# Patient Record
Sex: Male | Born: 1980
Health system: Southern US, Community
[De-identification: ages and names within clinical notes are randomized; demographics above are authoritative.]

## PROBLEM LIST (undated history)

## (undated) DIAGNOSIS — I10 Essential (primary) hypertension: Secondary | ICD-10-CM

## (undated) HISTORY — PX: EYE SURGERY: SHX253

---

## 2013-06-11 ENCOUNTER — Emergency Department (HOSPITAL_BASED_OUTPATIENT_CLINIC_OR_DEPARTMENT_OTHER)
Admission: EM | Admit: 2013-06-11 | Discharge: 2013-06-11 | Disposition: A | Discharge status: Home / Self Care | Payer: 59 | Attending: Emergency Medicine | Admitting: Emergency Medicine

## 2013-06-11 ENCOUNTER — Encounter (HOSPITAL_BASED_OUTPATIENT_CLINIC_OR_DEPARTMENT_OTHER): Payer: Self-pay | Admitting: Emergency Medicine

## 2013-06-11 DIAGNOSIS — F17200 Nicotine dependence, unspecified, uncomplicated: Secondary | ICD-10-CM | POA: Insufficient documentation

## 2013-06-11 DIAGNOSIS — I10 Essential (primary) hypertension: Secondary | ICD-10-CM | POA: Insufficient documentation

## 2013-06-11 DIAGNOSIS — E876 Hypokalemia: Secondary | ICD-10-CM | POA: Insufficient documentation

## 2013-06-11 HISTORY — DX: Essential (primary) hypertension: I10

## 2013-06-11 LAB — BASIC METABOLIC PANEL
BUN: 9 mg/dL (ref 6–23)
CHLORIDE: 101 meq/L (ref 96–112)
CO2: 28 mEq/L (ref 19–32)
CREATININE: 0.8 mg/dL (ref 0.50–1.35)
Calcium: 9.3 mg/dL (ref 8.4–10.5)
GFR calc Af Amer: >90 mL/min (ref >90)
GFR calc non Af Amer: >90 mL/min (ref >90)
Glucose, Bld: 109 mg/dL — ABNORMAL HIGH (ref 70–99)
Potassium: 3 mEq/L — ABNORMAL LOW (ref 3.7–5.3)
SODIUM: 142 meq/L (ref 137–147)

## 2013-06-11 MED ORDER — POTASSIUM CHLORIDE CRYS ER 20 MEQ PO TBCR
40.0000 meq | EXTENDED_RELEASE_TABLET | Freq: Once | ORAL | Status: AC
  Administered 2013-06-11: 40 meq via ORAL
  Filled 2013-06-11: qty 2

## 2013-06-11 MED ORDER — POTASSIUM CHLORIDE CRYS ER 20 MEQ PO TBCR: 20.0000 meq | EXTENDED_RELEASE_TABLET | Freq: Two times a day (BID) | ORAL | Status: AC

## 2013-06-11 NOTE — ED Notes (Signed)
Pain in his legs for the past 2 weeks since starting BP medication.

## 2013-06-11 NOTE — ED Provider Notes (Signed)
CSN: [id number]     Arrival date & time 06/11/13  1710 History   First MD Initiated Contact with Patient 06/11/13 1725     Chief Complaint  Patient presents with  . Leg Pain   (Consider location/radiation/quality/duration/timing/severity/associated sxs/prior Treatment) HPI Comments: Pt c/o bilateral leg pain that started 2 weeks ago. Pt states that he started his bp medication 3 weeks ago. Pt was started on amlodipine. Denies swelling, redness or warmth:pt states that he wrapped his legs last night and the pain was worse this morning:pt states that he didn't take his bp medication today because he thought it might be the cause of his pain:denies cp  The history is provided by the patient. No language interpreter was used.    Past Medical History  Diagnosis Date  . Hypertension    Past Surgical History  Procedure Laterality Date  . Eye surgery     No family history on file. History  Substance Use Topics  . Smoking status: Current Every Day Smoker -- 0.50 packs/day    Types: Cigarettes  . Smokeless tobacco: Not on file  . Alcohol Use: Yes    Review of Systems  Constitutional: Negative.   Respiratory: Negative.   Cardiovascular: Negative.     Allergies  Percocet  Home Medications  No current outpatient prescriptions on file. BP 190/115  Pulse 86  Temp(Src) 98.3 F (36.8 C) (Oral)  Resp 20  Ht 6\' 2"  (1.88 m)  Wt 290 lb (131.543 kg)  BMI 37.22 kg/m2  SpO2 97% Physical Exam  Nursing note and vitals reviewed. Constitutional: He appears well-developed and well-nourished.  Cardiovascular: Normal rate and regular rhythm.   Pulmonary/Chest: Effort normal and breath sounds normal.  Abdominal: Bowel sounds are normal.  Musculoskeletal: Normal range of motion.  No swelling redness or warmth noted to bilateral lower extremities  Neurological: He is alert. Coordination normal.  Skin: Skin is warm and dry.    ED Course  Procedures (including critical care time) Labs  Review Labs Reviewed  BASIC METABOLIC PANEL - Abnormal; Notable for the following:    Potassium 3.0 (*)    Glucose, Bld 109 (*)    All other components within normal limits   Imaging Review No results found.  EKG Interpretation   None       MDM   1. Hypokalemia   2. HTN (hypertension)    Will treat for hypokalemia:will have pt replace with potassium in the next couple of days:discussed with pt that he need to have labs redrawn in the next week    Teressa LowerVrinda Maleeyah Mccaughey, NP 06/11/13 1942

## 2013-06-11 NOTE — Discharge Instructions (Signed)
Potassium Content of Foods Potassium is a mineral found in many foods and drinks. It helps keep fluids and minerals balanced in your body and also affects how steadily your heart beats. The body needs potassium to control blood pressure and to keep the muscles and nervous system healthy. However, certain health conditions and medicine may require you to eat more or less potassium-rich foods and drinks. Your caregiver or dietitian will tell you how much potassium you should have each day. COMMON SERVING SIZES The list below tells you how big or small common portion sizes are:  1 oz.........4 stacked dice.  3 oz.........Deck of cards.  1 tsp........Tip of little finger.  1 tbsp......Thumb.  2 tbsp......Golf ball.   c...........Half of a fist.  1 c............A fist. FOODS AND DRINKS HIGH IN POTASSIUM More than 200 mg of potassium per serving. A serving size is  c (120 mL or noted gram weight) unless otherwise stated. While all the items on this list are high in potassium, some items are higher in potassium than others. Fruits  Apricots (sliced), 83 g.  Apricots (dried halves), 3 oz / 24 g.  Avocado (cubed),  c / 50 g.  Banana (sliced), 75 g.  Cantaloupe (cubed), 80 g.  Dates (pitted), 5 whole / 35 g.  Figs (dried), 4 whole / 32 g.  Guava, c / 55 g.  Honeydew, 1 wedge / 85 g.  Kiwi (sliced), 90 g.  Nectarine, 1 small / 129 g.  Orange, 1 medium / 131 g.  Orange juice.  Pomegranate seeds, 87 g.  Pomegranate juice.  Prunes (pitted), 3 whole / 30 g.  Prune juice, 3 oz / 90 mL.  Seedless raisins, 3 tbsp / 27 g. Vegetables  Artichoke,  of a medium / 64 g.  Asparagus (boiled), 90 g.  Baked beans,  c / 63 g.  Bamboo shoots,  c / 38 g.  Beets (cooked slices), 85 g.  Broccoli (boiled), 78 g.  Brussels sprout (boiled), 78 g.  Butternut squash (baked), 103 g.  Chickpea (cooked), 82 g.  Green peas (cooked), 80 g.  Hubbard squash (baked cubes),  c /  68 g.  Kidney beans (cooked), 5 tbsp / 55 g.  Lima beans (cooked),  c / 43 g.  Navy beans (cooked),  c / 61 g.  Potato (baked), 61 g.  Potato (boiled), 78 g.  Pumpkin (boiled), 123 g.  Refried beans,  c / 79 g.  Spinach (cooked),  c / 45 g.  Split peas (cooked),  c / 65 g.  Sun-dried tomatoes, 2 tbsp / 7 g.  Sweet potato (baked),  c / 50 g.  Tomato (chopped or sliced), 90 g.  Tomato juice.  Tomato paste, 4 tsp / 21 g.  Tomato sauce,  c / 61 g.  Vegetable juice.  White mushrooms (cooked), 78 g.  Yam (cooked or baked),  c / 34 g.  Zucchini squash (boiled), 90 g. Other Foods and Drinks  Almonds (whole),  c / 36 g.  Cashews (oil roasted),  c / 32 g.  Chocolate milk.  Chocolate pudding, 142 g.  Clams (steamed), 1.5 oz / 43 g.  Dark chocolate, 1.5 oz / 42 g.  Fish, 3 oz / 85 g.  King crab (steamed), 3 oz / 85 g.  Lobster (steamed), 4 oz / 113 g.  Milk (skim, 1%, 2%, whole), 1 c / 240 mL.  Milk chocolate, 2.3 oz / 66 g.  Milk shake.  Nonfat fruit   variety yogurt, 123 g.  Peanuts (oil roasted), 1 oz / 28 g.  Peanut butter, 2 tbsp / 32 g.  Pistachio nuts, 1 oz / 28 g.  Pumpkin seeds, 1 oz / 28 g.  Red meat (broiled, cooked, grilled), 3 oz / 85 g.  Scallops (steamed), 3 oz / 85 g.  Shredded wheat cereal (dry), 3 oblong biscuits / 75 g.  Spaghetti sauce,  c / 66 g.  Sunflower seeds (dry roasted), 1 oz / 28 g.  Veggie burger, 1 patty / 70 g. FOODS MODERATE IN POTASSIUM Between 150 mg and 200 mg per serving. A serving is  c (120 mL or noted gram weight) unless otherwise stated. Fruits  Grapefruit,  of the fruit / 123 g.  Grapefruit juice.  Pineapple juice.  Plums (sliced), 83 g.  Tangerine, 1 large / 120 g. Vegetables  Carrots (boiled), 78 g.  Carrots (sliced), 61 g.  Rhubarb (cooked with sugar), 120 g.  Rutabaga (cooked), 120 g.  Sweet corn (cooked), 75 g.  Yellow snap beans (cooked), 63 g. Other Foods and  Drinks   Bagel, 1 bagel / 98 g.  Chicken breast (roasted and chopped),  c / 70 g.  Chocolate ice cream / 66 g.  Pita bread, 1 large / 64 g.  Shrimp (steamed), 4 oz / 113 g.  Swiss cheese (diced), 70 g.  Vanilla ice cream, 66 g.  Vanilla pudding, 140 g. FOODS LOW IN POTASSIUM Less than 150 mg per serving. A serving size is  cup (120 mL or noted gram weight) unless otherwise stated. If you eat more than 1 serving of a food low in potassium, the food may be considered a food high in potassium. Fruits  Apple (slices), 55 g.  Apple juice.  Applesauce, 122 g.  Blackberries, 72 g.  Blueberries, 74 g.  Cranberries, 50 g.  Cranberry juice.  Fruit cocktail, 119 g.  Fruit punch.  Grapes, 46 g.  Grape juice.  Mandarin oranges (canned), 126 g.  Peach (slices), 77 g.  Pineapple (chunks), 83 g.  Raspberries, 62 g.  Red cherries (without pits), 78 g.  Strawberries (sliced), 83 g.  Watermelon (diced), 76 g. Vegetables  Alfalfa sprouts, 17 g.  Bell peppers (sliced), 46 g.  Cabbage (shredded), 35 g.  Cauliflower (boiled), 62 g.  Celery, 51 g.  Collard greens (boiled), 95 g.  Cucumber (sliced), 52 g.  Eggplant (cubed), 41 g.  Green beans (boiled), 63 g.  Lettuce (shredded), 1 c / 36 g.  Onions (sauteed), 44 g.  Radishes (sliced), 58 g.  Spaghetti squash, 51 g. Other Foods and Drinks  Angel food cake, 1 slice / 28 g.  Black tea.  Brown rice (cooked), 98 g.  Butter croissant, 1 medium / 57 g.  Carbonated soda.  Coffee.  Cheddar cheese (diced), 66 g.  Corn flake cereal (dry), 14 g.  Cottage cheese, 118 g.  Cream of rice cereal (cooked), 122 g.  Cream of wheat cereal (cooked), 126 g.  Crisped rice cereal (dry), 14 g.  Egg (boiled, fried, poached, omelet, scrambled), 1 large / 46 61 g.  English muffin, 1 muffin / 57 g.  Frozen ice pop, 1 pop / 55 g.  Graham cracker, 1 large rectangular cracker / 14 g.  Jelly beans, 112  g.  Non-dairy whipped topping.  Oatmeal, 88 g.  Orange sherbet, 74 g.  Puffed rice cereal (dry), 7 g.  Pasta (cooked), 70 g.  Rice cakes, 4 cakes / 36   g.  Sugared doughnut, 4 oz / 116 g.  White bread, 1 slice / 30 g.  White rice (cooked), 79 93 g.  Wild rice (cooked), 82 g.  Yellow cake, 1 slice / 68 g. Document Released: 12/25/2004 Document Revised: 04/29/2012 Document Reviewed: 09/27/2011 [employer] Patient Information 2014 [employer]. Hypokalemia Hypokalemia means that the amount of potassium in the blood is lower than normal.Potassium is a chemical, called an electrolyte, that helps regulate the amount of fluid in the body. It also stimulates muscle contraction and helps nerves function properly.Most of the body's potassium is inside of cells, and only a very small amount is in the blood. Because the amount in the blood is so small, minor changes can be life-threatening. CAUSES  Antibiotics.  Diarrhea or vomiting.  Using laxatives too much, which can cause diarrhea.  Chronic kidney disease.  Water pills (diuretics).  Eating disorders (bulimia).  Low magnesium level.  Sweating a lot. SIGNS AND SYMPTOMS  Weakness.  Constipation.  Fatigue.  Muscle cramps.  Mental confusion.  Skipped heartbeats or irregular heartbeat (palpitations).  Tingling or numbness. DIAGNOSIS  Your health care provider can diagnose hypokalemia with blood tests. In addition to checking your potassium level, your health care provider may also check other lab tests. TREATMENT Hypokalemia can be treated with potassium supplements taken by mouth or adjustments in your current medicines. If your potassium level is very low, you may need to get potassium through a vein (IV) and be monitored in the hospital. A diet high in potassium is also helpful. Foods high in potassium are:  Nuts, such as peanuts and pistachios.  Seeds, such as sunflower seeds and pumpkin seeds.  Peas,  lentils, and lima beans.  Whole grain and bran cereals and breads.  Fresh fruit and vegetables, such as apricots, avocado, bananas, cantaloupe, kiwi, oranges, tomatoes, asparagus, and potatoes.  Orange and tomato juices.  Red meats.  Fruit yogurt. HOME CARE INSTRUCTIONS  Take all medicines as prescribed by your health care provider.  Maintain a healthy diet by including nutritious food, such as fruits, vegetables, nuts, whole grains, and lean meats.  If you are taking a laxative, be sure to follow the directions on the label. SEEK MEDICAL CARE IF:  Your weakness gets worse.  You feel your heart pounding or racing.  You are vomiting or having diarrhea.  You are diabetic and having trouble keeping your blood glucose in the normal range. SEEK IMMEDIATE MEDICAL CARE IF:  You have chest pain, shortness of breath, or dizziness.  You are vomiting or having diarrhea for more than 2 days.  You faint. MAKE SURE YOU:   Understand these instructions.  Will watch your condition.  Will get help right away if you are not doing well or get worse. Document Released: 05/13/2005 Document Revised: 03/03/2013 Document Reviewed: 11/13/2012 [employer] Patient Information 2014 [employer].  

## 2013-06-16 NOTE — ED Provider Notes (Signed)
Medical screening examination/treatment/procedure(s) were performed by non-physician practitioner and as supervising physician I was immediately available for consultation/collaboration.    Mirta Mally L Aribella Vavra, MD 06/16/13 0807 

## 2013-07-11 ENCOUNTER — Emergency Department (HOSPITAL_BASED_OUTPATIENT_CLINIC_OR_DEPARTMENT_OTHER): Payer: 59

## 2013-07-11 ENCOUNTER — Emergency Department (HOSPITAL_BASED_OUTPATIENT_CLINIC_OR_DEPARTMENT_OTHER)
Admission: EM | Admit: 2013-07-11 | Discharge: 2013-07-11 | Disposition: A | Discharge status: Home / Self Care | Payer: 59 | Attending: Emergency Medicine | Admitting: Emergency Medicine

## 2013-07-11 ENCOUNTER — Encounter (HOSPITAL_BASED_OUTPATIENT_CLINIC_OR_DEPARTMENT_OTHER): Payer: Self-pay | Admitting: Emergency Medicine

## 2013-07-11 DIAGNOSIS — E876 Hypokalemia: Secondary | ICD-10-CM | POA: Insufficient documentation

## 2013-07-11 DIAGNOSIS — Z79899 Other long term (current) drug therapy: Secondary | ICD-10-CM | POA: Insufficient documentation

## 2013-07-11 DIAGNOSIS — I10 Essential (primary) hypertension: Secondary | ICD-10-CM | POA: Insufficient documentation

## 2013-07-11 DIAGNOSIS — F17200 Nicotine dependence, unspecified, uncomplicated: Secondary | ICD-10-CM | POA: Insufficient documentation

## 2013-07-11 DIAGNOSIS — M7989 Other specified soft tissue disorders: Secondary | ICD-10-CM | POA: Insufficient documentation

## 2013-07-11 LAB — CBC WITH DIFFERENTIAL/PLATELET
BASOS ABS: 0 10*3/uL (ref 0.0–0.1)
Basophils Relative: 0 % (ref 0–1)
Eosinophils Absolute: 0.4 10*3/uL (ref 0.0–0.7)
Eosinophils Relative: 6 % — ABNORMAL HIGH (ref 0–5)
HEMATOCRIT: 39 % (ref 39.0–52.0)
Hemoglobin: 12.6 g/dL — ABNORMAL LOW (ref 13.0–17.0)
Lymphocytes Relative: 26 % (ref 12–46)
Lymphs Abs: 1.7 10*3/uL (ref 0.7–4.0)
MCH: 26.1 pg (ref 26.0–34.0)
MCHC: 32.3 g/dL (ref 30.0–36.0)
MCV: 80.7 fL (ref 78.0–100.0)
Monocytes Absolute: 0.7 10*3/uL (ref 0.1–1.0)
Monocytes Relative: 12 % (ref 3–12)
Neutro Abs: 3.6 10*3/uL (ref 1.7–7.7)
Neutrophils Relative %: 56 % (ref 43–77)
Platelets: 301 10*3/uL (ref 150–400)
RBC: 4.83 MIL/uL (ref 4.22–5.81)
RDW: 14 % (ref 11.5–15.5)
WBC: 6.4 10*3/uL (ref 4.0–10.5)

## 2013-07-11 LAB — BASIC METABOLIC PANEL
BUN: 11 mg/dL (ref 6–23)
CO2: 29 meq/L (ref 19–32)
Calcium: 9.1 mg/dL (ref 8.4–10.5)
Chloride: 101 mEq/L (ref 96–112)
Creatinine, Ser: 0.7 mg/dL (ref 0.50–1.35)
GFR calc Af Amer: >90 mL/min (ref >90)
GFR calc non Af Amer: >90 mL/min (ref >90)
Glucose, Bld: 103 mg/dL — ABNORMAL HIGH (ref 70–99)
Potassium: 3.3 mEq/L — ABNORMAL LOW (ref 3.7–5.3)
SODIUM: 141 meq/L (ref 137–147)

## 2013-07-11 LAB — POCT I-STAT TROPONIN I: TROPONIN I, POC: 0.01 ng/mL (ref 0.00–0.08)

## 2013-07-11 LAB — PRO B NATRIURETIC PEPTIDE: PRO B NATRI PEPTIDE: 43 pg/mL (ref 0–125)

## 2013-07-11 MED ORDER — HYDROCHLOROTHIAZIDE 25 MG PO TABS: 25.0000 mg | ORAL_TABLET | Freq: Every day | ORAL | Status: AC

## 2013-07-11 MED ORDER — POTASSIUM CHLORIDE ER 10 MEQ PO TBCR: 10.0000 meq | EXTENDED_RELEASE_TABLET | Freq: Every day | ORAL | Status: AC

## 2013-07-11 NOTE — ED Provider Notes (Signed)
CSN: [id number]     Arrival date & time 07/11/13  [id number] History   First MD Initiated Contact with Patient 07/11/13 1150     Chief Complaint  Patient presents with  . Leg Swelling     (Consider location/radiation/quality/duration/timing/severity/associated sxs/prior Treatment) HPI Pt with hx of hypertension presents with bilateral lower extremity swelling.  Pt states this has been ongoing for several weeks.  He states he started amlodipine 2 months ago and swelling has continued and worsened somewhat.  No sob, no chest pain.  No unilateral swelling.  He feels that the swelling is causing pain in bilateral feet.  Symptoms are constant.  He endorses taking his amlodipine daily.  There are no other associated systemic symptoms, there are no other alleviating or modifying factors.   Past Medical History  Diagnosis Date  . Hypertension    Past Surgical History  Procedure Laterality Date  . Eye surgery     No family history on file. History  Substance Use Topics  . Smoking status: Current Every Day Smoker -- 0.50 packs/day    Types: Cigarettes  . Smokeless tobacco: Not on file  . Alcohol Use: Yes    Review of Systems ROS reviewed and all otherwise negative except for mentioned in HPI    Allergies  Percocet  Home Medications   Current Outpatient Rx  Name  Route  Sig  Dispense  Refill  . amLODipine (NORVASC) 5 MG tablet   Oral   Take 5 mg by mouth daily.         . hydrochlorothiazide (HYDRODIURIL) 25 MG tablet   Oral   Take 1 tablet (25 mg total) by mouth daily.   30 tablet   0   . potassium chloride (K-DUR) 10 MEQ tablet   Oral   Take 1 tablet (10 mEq total) by mouth daily.   15 tablet   0   . potassium chloride SA (K-DUR,KLOR-CON) 20 MEQ tablet   Oral   Take 1 tablet (20 mEq total) by mouth 2 (two) times daily.   10 tablet   0    BP 154/107  Pulse 81  Temp(Src) 98.8 F (37.1 C) (Oral)  Resp 18  Ht 6\' 2"  (1.88 m)  Wt 285 lb (129.275 kg)  BMI 36.58  kg/m2  SpO2 96% Vitals reviewed Physical Exam Physical Examination: General appearance - alert, well appearing, and in no distress Mental status - alert, oriented to person, place, and time Eyes - no conjunctival injection, no scleral icterus Mouth - mucous membranes moist, pharynx normal without lesions Chest - clear to auscultation, no wheezes, rales or rhonchi, symmetric air entry Heart - normal rate, regular rhythm, normal S1, S2, no murmurs, rubs, clicks or gallops Abdomen - soft, nontender, nondistended, no masses or organomegaly Extremities - bilateral lower extremity 2+ pitting edema, symmetric, no clubbing or cyanosis Skin - normal coloration and turgor, no rashes  ED Course  Procedures (including critical care time) Labs Review Labs Reviewed  CBC WITH DIFFERENTIAL - Abnormal; Notable for the following:    Hemoglobin 12.6 (*)    Eosinophils Relative 6 (*)    All other components within normal limits  BASIC METABOLIC PANEL - Abnormal; Notable for the following:    Potassium 3.3 (*)    Glucose, Bld 103 (*)    All other components within normal limits  PRO B NATRIURETIC PEPTIDE  POCT I-STAT TROPONIN I   Imaging Review Dg Chest 2 View  07/11/2013   CLINICAL DATA:  Lower  extremity swelling for several weeks.  EXAM: CHEST  2 VIEW  COMPARISON:  None.  FINDINGS: Lungs are hypoinflated with minimal prominence of the perihilar markings likely due to the degree of hypoinflation. There is borderline cardiomegaly. Remaining bones and soft tissues are within normal.  IMPRESSION: Hypoinflation without acute cardiopulmonary disease.   Electronically Signed   By: Elberta Fortis M.D.   On: 07/11/2013 12:04    EKG Interpretation    Date/Time:  Sunday July 11 2013 11:48:51 EST Ventricular Rate:  71 PR Interval:  168 QRS Duration: 100 QT Interval:  400 QTC Calculation: 434 R Axis:   72 Text Interpretation:  Normal sinus rhythm Cannot rule out Anterior infarct , age undetermined T  wave abnormality, consider inferior ischemia Abnormal ECG No old tracing to compare Confirmed by [facility]  MD, Adreyan Carbajal [phone]) on 07/11/2013 1:14:16 PM            MDM   Final diagnoses:  Leg swelling  Hypertension  Hypokalemia    Pt presenting with c/o lower extremity edema, this has been ongoing for several weeks, no asymmetry to suggest blood clot.  Pt without signs of pulmonary edema on CXR or on exam.  No chest discomfort.  Mild hypokalemia but improved from prior ED visit.  Pt encouraged to arrange for followup with PMD for blood pressure monitoring and recheck of edema.  Will start on low dose HCTZ, given potassium supplements as well.  Discharged with strict return precautions.  Pt agreeable with plan.    Ethelda Chick, MD 07/12/13 [phone]

## 2013-07-11 NOTE — ED Notes (Signed)
Patient here with lower leg swelling for several weeks, reports that he started BP meds in December and the swelling still happening. Now has pain in bilateral ankles and feet since Friday. Edema noted to both on exam

## 2013-07-11 NOTE — Discharge Instructions (Signed)
Return to the ED with any concerns including difficulty breathing, chest pain, assymetric leg swelling, fainting, decreased level of alertness/lethargy, or any other alarming symptoms

## 2013-08-05 ENCOUNTER — Ambulatory Visit: Payer: 59 | Admitting: Physician Assistant

## 2013-08-05 DIAGNOSIS — Z0289 Encounter for other administrative examinations: Secondary | ICD-10-CM

## 2014-07-12 IMAGING — CR DG CHEST 2V
2 series · 2 of 2 positions shown · non-contrast
Comparison: None.

CLINICAL DATA: Lower extremity swelling for several weeks.

EXAM:
CHEST  2 VIEW

[w chest pa]
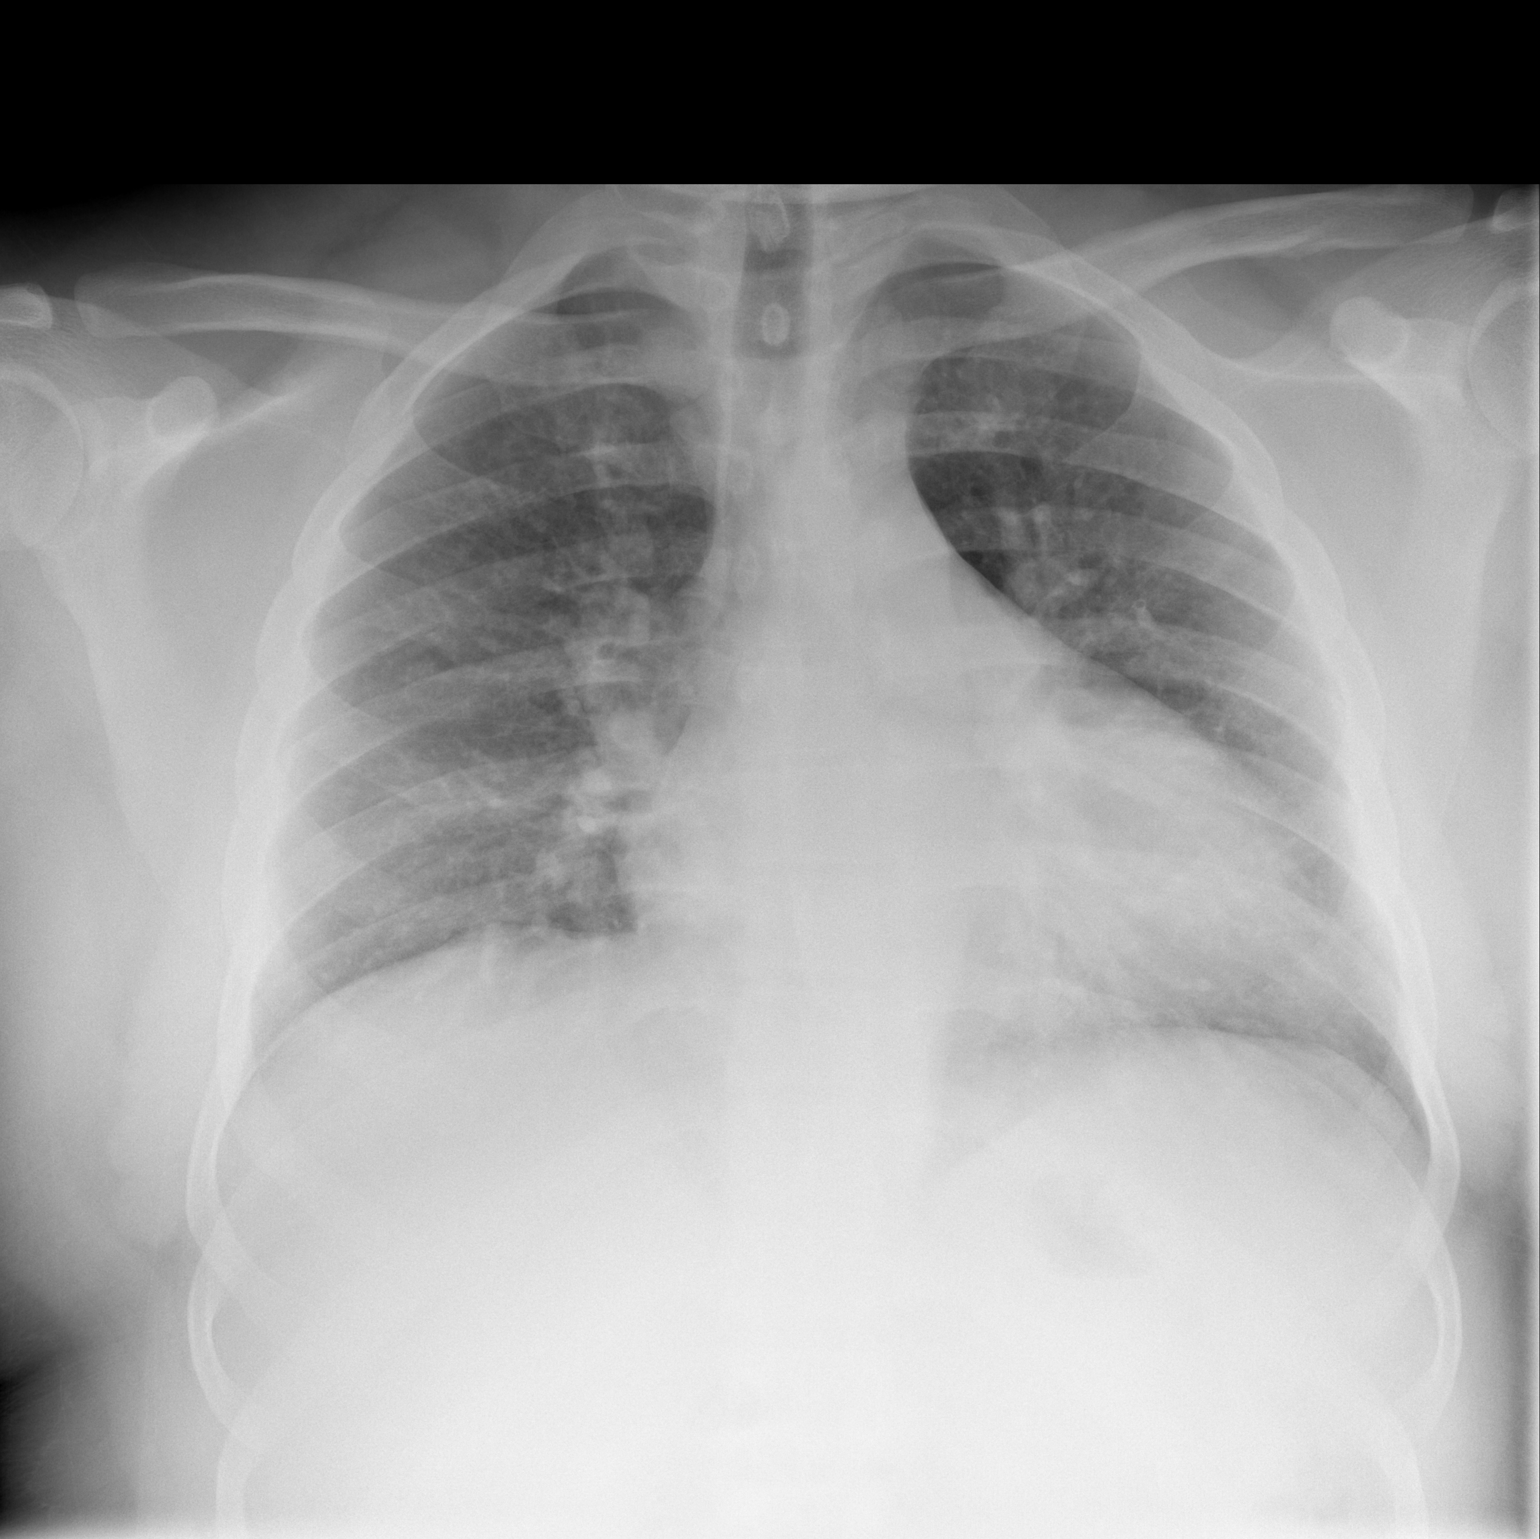

[w chest lat *]
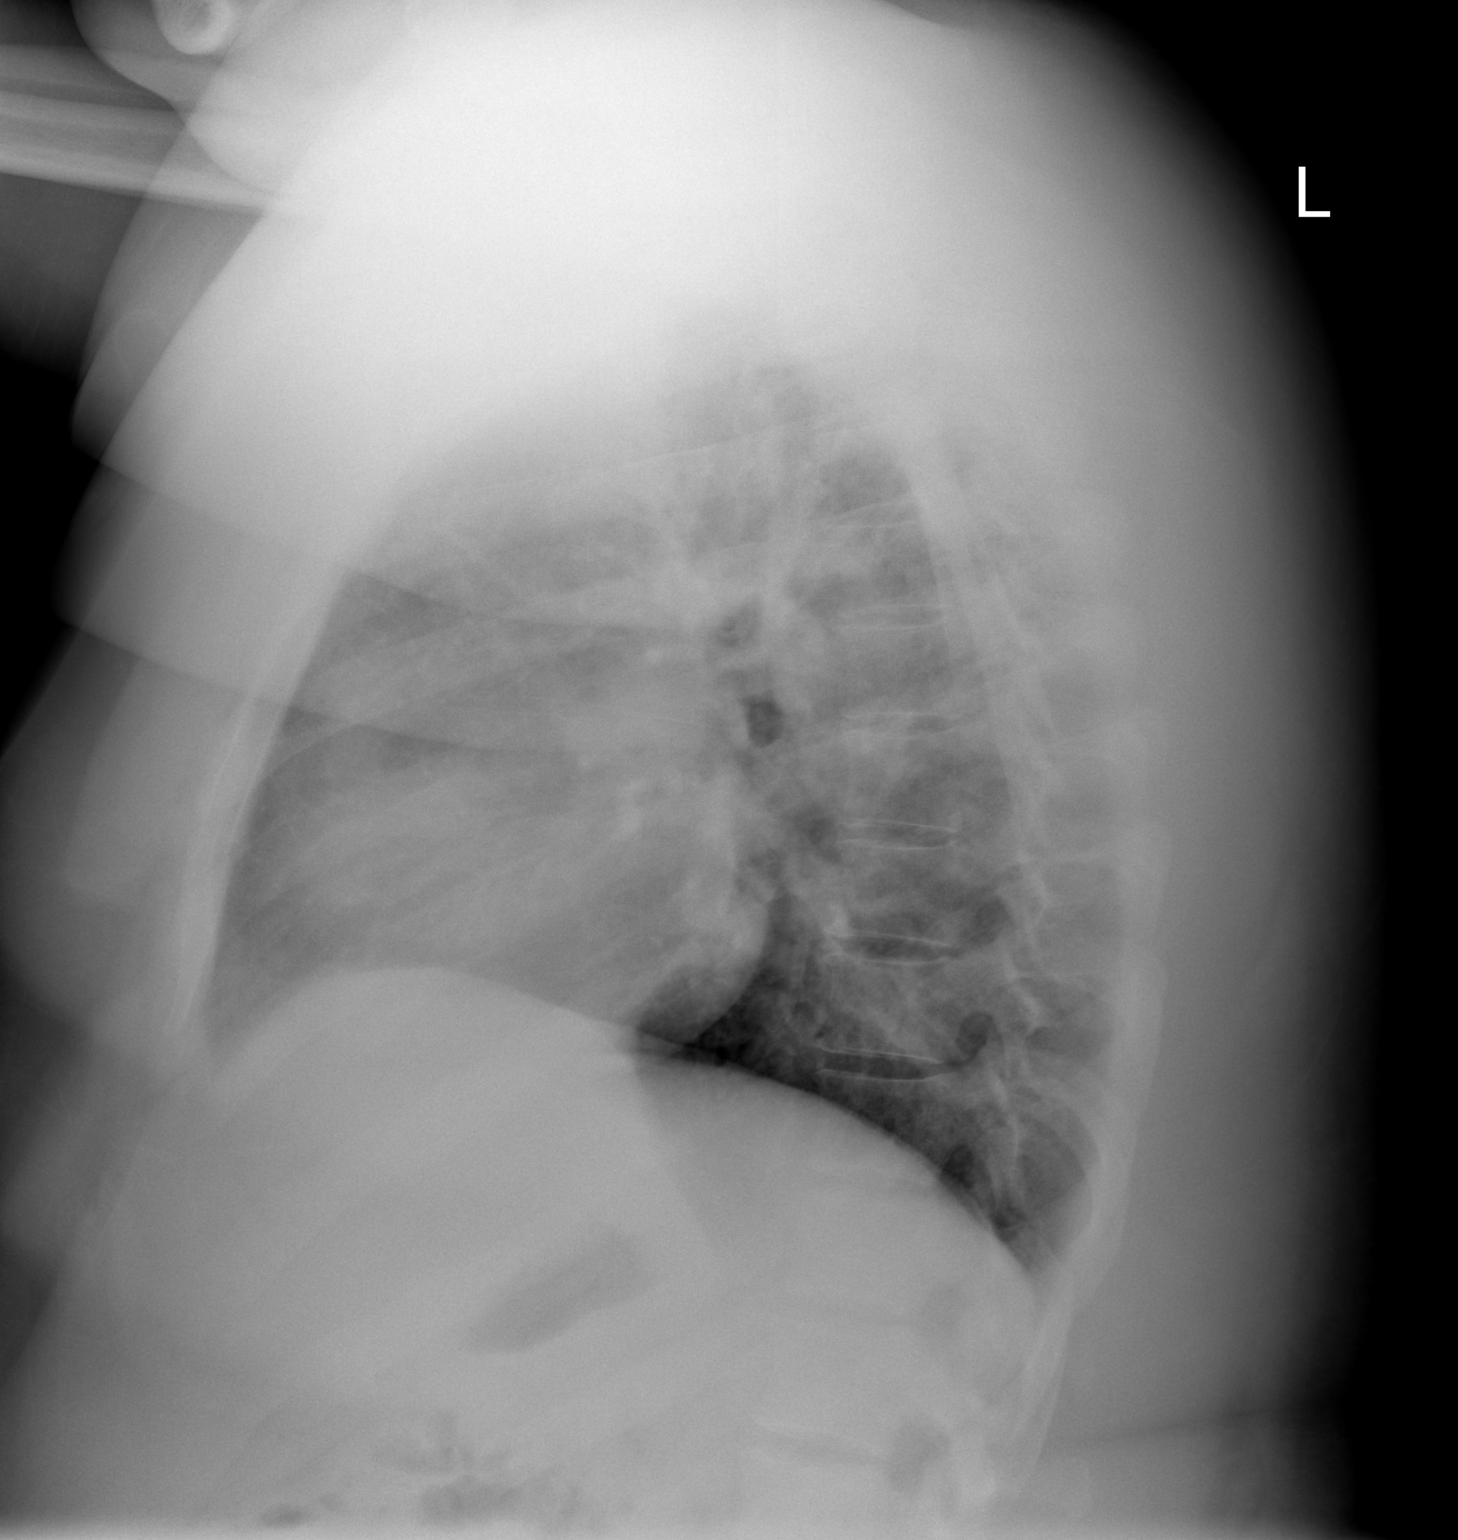

[2 of 2 positions shown; findings below may reference images not displayed]

FINDINGS: Lungs are hypoinflated with minimal prominence of the perihilar
markings likely due to the degree of hypoinflation. There is
borderline cardiomegaly. Remaining bones and soft tissues are within
normal.
IMPRESSION: Hypoinflation without acute cardiopulmonary disease.

## 2014-10-04 ENCOUNTER — Emergency Department (HOSPITAL_BASED_OUTPATIENT_CLINIC_OR_DEPARTMENT_OTHER): Admission: EM | Admit: 2014-10-04 | Discharge: 2014-10-04 | Discharge status: Absent without leave | Payer: Self-pay

## 2015-02-20 ENCOUNTER — Emergency Department (HOSPITAL_BASED_OUTPATIENT_CLINIC_OR_DEPARTMENT_OTHER)
Admission: EM | Admit: 2015-02-20 | Discharge: 2015-02-20 | Disposition: A | Discharge status: Home / Self Care | Payer: Self-pay | Attending: Physician Assistant | Admitting: Physician Assistant

## 2015-02-20 ENCOUNTER — Encounter (HOSPITAL_BASED_OUTPATIENT_CLINIC_OR_DEPARTMENT_OTHER): Payer: Self-pay | Admitting: Emergency Medicine

## 2015-02-20 ENCOUNTER — Emergency Department (HOSPITAL_BASED_OUTPATIENT_CLINIC_OR_DEPARTMENT_OTHER): Payer: Self-pay

## 2015-02-20 DIAGNOSIS — Z79899 Other long term (current) drug therapy: Secondary | ICD-10-CM | POA: Insufficient documentation

## 2015-02-20 DIAGNOSIS — Y990 Civilian activity done for income or pay: Secondary | ICD-10-CM | POA: Insufficient documentation

## 2015-02-20 DIAGNOSIS — Y9289 Other specified places as the place of occurrence of the external cause: Secondary | ICD-10-CM | POA: Insufficient documentation

## 2015-02-20 DIAGNOSIS — S60222A Contusion of left hand, initial encounter: Secondary | ICD-10-CM | POA: Insufficient documentation

## 2015-02-20 DIAGNOSIS — Z720 Tobacco use: Secondary | ICD-10-CM | POA: Insufficient documentation

## 2015-02-20 DIAGNOSIS — I10 Essential (primary) hypertension: Secondary | ICD-10-CM | POA: Insufficient documentation

## 2015-02-20 DIAGNOSIS — W231XXA Caught, crushed, jammed, or pinched between stationary objects, initial encounter: Secondary | ICD-10-CM | POA: Insufficient documentation

## 2015-02-20 DIAGNOSIS — Y9389 Activity, other specified: Secondary | ICD-10-CM | POA: Insufficient documentation

## 2015-02-20 MED ORDER — IBUPROFEN 400 MG PO TABS
600.0000 mg | ORAL_TABLET | Freq: Once | ORAL | Status: AC
  Administered 2015-02-20: 600 mg via ORAL
  Filled 2015-02-20 (×2): qty 1

## 2015-02-20 MED ORDER — IBUPROFEN 600 MG PO TABS: 600.0000 mg | ORAL_TABLET | Freq: Four times a day (QID) | ORAL | Status: AC | PRN

## 2015-02-20 NOTE — ED Provider Notes (Signed)
CSN: [id number]     Arrival date & time 02/20/15  1857 History   First MD Initiated Contact with Patient 02/20/15 2030     Chief Complaint  Patient presents with  . Hand Injury     (Consider location/radiation/quality/duration/timing/severity/associated sxs/prior Treatment) HPI Comments: Patient presents with complaint of left hand injury just PTA. Patient was at work when he reached to grab a machine part that fell between a conveyor belt and a table. He was reaching for the part and the conveyor restarted and he momentarily had his hand pinched. He was able to pull his hand out but had pain in his fingers and had afterwards. Patient states that his hand pinched from the fingertips up to the MCP joints. Patient states that the pain radiates up into his forearm. Pain is worse with movement of the wrist. No treatments prior to arrival. Patient has a small abrasion on the third digit.  Patient is a 34 y.o. male presenting with hand injury. The history is provided by the patient.  Hand Injury Associated symptoms: no back pain and no neck pain     Past Medical History  Diagnosis Date  . Hypertension    Past Surgical History  Procedure Laterality Date  . Eye surgery     History reviewed. No pertinent family history. Social History  Substance Use Topics  . Smoking status: Current Every Day Smoker -- 0.50 packs/day    Types: Cigarettes  . Smokeless tobacco: None  . Alcohol Use: Yes    Review of Systems  Constitutional: Negative for activity change.  Musculoskeletal: Positive for arthralgias. Negative for back pain, joint swelling, gait problem and neck pain.  Skin: Negative for wound.  Neurological: Negative for numbness.      Allergies  Percocet  Home Medications   Prior to Admission medications   Medication Sig Start Date End Date Taking? Authorizing Arch Methot  amLODipine (NORVASC) 5 MG tablet Take 5 mg by mouth daily.    Historical Zooey Schreurs, MD  hydrochlorothiazide  (HYDRODIURIL) 25 MG tablet Take 1 tablet (25 mg total) by mouth daily. 07/11/13   Jerelyn Scott, MD  potassium chloride (K-DUR) 10 MEQ tablet Take 1 tablet (10 mEq total) by mouth daily. 07/11/13   Jerelyn Scott, MD  potassium chloride SA (K-DUR,KLOR-CON) 20 MEQ tablet Take 1 tablet (20 mEq total) by mouth 2 (two) times daily. 06/11/13   Teressa Lower, NP   BP 156/113 mmHg  Pulse 83  Temp(Src) 98.9 F (37.2 C) (Oral)  Resp 18  Ht  (1.88 m)  Wt 270 lb (122.471 kg)  BMI 34.65 kg/m2  SpO2 100% Physical Exam  Constitutional: He appears well-developed and well-nourished.  HENT:  Head: Normocephalic and atraumatic.  Eyes: Conjunctivae are normal.  Neck: Normal range of motion. Neck supple.  Cardiovascular: Normal pulses.   Pulses:      Radial pulses are 2+ on the right side, and 2+ on the left side.  Musculoskeletal: He exhibits tenderness. He exhibits no edema.       Left shoulder: Normal.       Left elbow: Normal.       Left wrist: He exhibits tenderness. He exhibits normal range of motion and no bony tenderness.       Left forearm: He exhibits no tenderness, no bony tenderness and no swelling.       Left hand: He exhibits tenderness. He exhibits normal range of motion. Normal sensation noted. Normal strength noted.  Compartments of left forearm are soft  and nontender.  Neurological: He is alert. No sensory deficit.  Motor, sensation, and vascular distal to the injury is fully intact.   Skin: Skin is warm and dry.  Psychiatric: He has a normal mood and affect.  Nursing note and vitals reviewed.   ED Course  Procedures (including critical care time) Labs Review Labs Reviewed - No data to display  Imaging Review Dg Hand Complete Left  02/20/2015   CLINICAL DATA:  Left hand caught in a can fair pelvis. Pain in the index, middle, and ring fingers.  EXAM: LEFT HAND - COMPLETE 3+ VIEW  COMPARISON:  None.  FINDINGS: There is no evidence of fracture or dislocation. There is no  evidence of arthropathy or other focal bone abnormality.  IMPRESSION: Negative.   Electronically Signed   By: Gaylyn Rong M.D.   On: 02/20/2015 19:43   I have personally reviewed and evaluated these images and lab results as part of my medical decision-making.   EKG Interpretation None      9:09 PM Patient seen and examined. Medications ordered. Will give velcro wrist splint for comfort.  Vital signs reviewed and are as follows: BP 156/113 mmHg  Pulse 83  Temp(Src) 98.9 F (37.2 C) (Oral)  Resp 18  Ht  (1.88 m)  Wt 270 lb (122.471 kg)  BMI 34.65 kg/m2  SpO2 100%  Patient was counseled on RICE protocol and told to rest injury, use ice for no longer than 15 minutes every hour, compress the area, and elevate above the level of their heart as much as possible to reduce swelling. Questions answered. Patient verbalized understanding.    MDM   Final diagnoses:  Contusion of left hand, initial encounter   Patient with minor crush injury of left hand. No signs of compartment syndrome. X-rays negative. Patient has full range of motion in digits and wrist but with tenderness. Do not suspect ligamentous injury. Extremity is neurovascularly intact.    Renne Crigler, PA-C 02/20/15 2118  Courteney Randall An, MD 02/20/15 2310

## 2015-02-20 NOTE — Discharge Instructions (Signed)
Please read and follow all provided instructions.  Your diagnoses today include:  1. Contusion of left hand, initial encounter    Tests performed today include:  An x-ray of the affected area - does NOT show any broken bones  Vital signs. See below for your results today.   Medications prescribed:   Ibuprofen (Motrin, Advil) - anti-inflammatory pain medication  Do not exceed  ibuprofen every 6 hours, take with food  You have been prescribed an anti-inflammatory medication or NSAID. Take with food. Take smallest effective dose for the shortest duration needed for your pain. Stop taking if you experience stomach pain or vomiting.   Take any prescribed medications only as directed.  Home care instructions:   Follow any educational materials contained in this packet  Follow R.I.C.E. Protocol:  R - rest your injury   I  - use ice on injury without applying directly to skin  C - compress injury with bandage or splint  E - elevate the injury as much as possible  Follow-up instructions: Please follow-up with your primary care provider if you continue to have significant pain in 1 week. In this case you may have a more severe injury that requires further care.   Return instructions:   Please return if your fingers are numb or tingling, appear gray or blue, or you have severe pain (also elevate the arm and loosen splint or wrap if you were given one)  Please return to the Emergency Department if you experience worsening symptoms.   Please return if you have any other emergent concerns.  Additional Information:  Your vital signs today were: BP 156/113 mmHg   Pulse 83   Temp(Src) 98.9 F (37.2 C) (Oral)   Resp 18   Ht  (1.88 m)   Wt 270 lb (122.471 kg)   BMI 34.65 kg/m2   SpO2 100% If your blood pressure (BP) was elevated above 135/85 this visit, please have this repeated by your doctor within one month. --------------

## 2015-02-20 NOTE — ED Notes (Signed)
Patient states that he got his left hand stuck in a conveyer belt. The patient has the finger wrapped up. Bleeding controlled

## 2015-11-14 ENCOUNTER — Other Ambulatory Visit: Payer: Self-pay

## 2015-11-14 ENCOUNTER — Emergency Department (HOSPITAL_BASED_OUTPATIENT_CLINIC_OR_DEPARTMENT_OTHER)
Admission: EM | Admit: 2015-11-14 | Discharge: 2015-11-14 | Disposition: A | Discharge status: Home / Self Care | Payer: BLUE CROSS/BLUE SHIELD | Attending: Emergency Medicine | Admitting: Emergency Medicine

## 2015-11-14 ENCOUNTER — Emergency Department (HOSPITAL_BASED_OUTPATIENT_CLINIC_OR_DEPARTMENT_OTHER): Payer: BLUE CROSS/BLUE SHIELD

## 2015-11-14 ENCOUNTER — Encounter (HOSPITAL_BASED_OUTPATIENT_CLINIC_OR_DEPARTMENT_OTHER): Payer: Self-pay | Admitting: Emergency Medicine

## 2015-11-14 DIAGNOSIS — R0789 Other chest pain: Secondary | ICD-10-CM | POA: Insufficient documentation

## 2015-11-14 DIAGNOSIS — F17210 Nicotine dependence, cigarettes, uncomplicated: Secondary | ICD-10-CM | POA: Insufficient documentation

## 2015-11-14 DIAGNOSIS — Z79899 Other long term (current) drug therapy: Secondary | ICD-10-CM | POA: Diagnosis not present

## 2015-11-14 DIAGNOSIS — I10 Essential (primary) hypertension: Secondary | ICD-10-CM | POA: Insufficient documentation

## 2015-11-14 LAB — CBC WITH DIFFERENTIAL/PLATELET
Basophils Absolute: 0 10*3/uL (ref 0.0–0.1)
Basophils Relative: 0 %
EOS ABS: 0.3 10*3/uL (ref 0.0–0.7)
Eosinophils Relative: 6 %
HCT: 42.2 % (ref 39.0–52.0)
Hemoglobin: 13.8 g/dL (ref 13.0–17.0)
LYMPHS ABS: 1.9 10*3/uL (ref 0.7–4.0)
LYMPHS PCT: 33 %
MCH: 26.4 pg (ref 26.0–34.0)
MCHC: 32.7 g/dL (ref 30.0–36.0)
MCV: 80.7 fL (ref 78.0–100.0)
MONO ABS: 0.5 10*3/uL (ref 0.1–1.0)
Monocytes Relative: 8 %
Neutro Abs: 3 10*3/uL (ref 1.7–7.7)
Neutrophils Relative %: 53 %
Platelets: 271 10*3/uL (ref 150–400)
RBC: 5.23 MIL/uL (ref 4.22–5.81)
RDW: 13.8 % (ref 11.5–15.5)
WBC: 5.7 10*3/uL (ref 4.0–10.5)

## 2015-11-14 LAB — BASIC METABOLIC PANEL
ANION GAP: 9 (ref 5–15)
BUN: 9 mg/dL (ref 6–20)
CHLORIDE: 101 mmol/L (ref 101–111)
CO2: 28 mmol/L (ref 22–32)
Calcium: 8.6 mg/dL — ABNORMAL LOW (ref 8.9–10.3)
Creatinine, Ser: 0.85 mg/dL (ref 0.61–1.24)
GFR calc non Af Amer: >60 mL/min (ref >60)
Glucose, Bld: 137 mg/dL — ABNORMAL HIGH (ref 65–99)
POTASSIUM: 3.1 mmol/L — AB (ref 3.5–5.1)
Sodium: 138 mmol/L (ref 135–145)

## 2015-11-14 LAB — TROPONIN I
Troponin I: <0.03 ng/mL (ref <0.031)
Troponin I: <0.03 ng/mL (ref <0.031)

## 2015-11-14 MED ORDER — AMLODIPINE BESYLATE 10 MG PO TABS: 10.0000 mg | ORAL_TABLET | Freq: Every day | ORAL | Status: AC

## 2015-11-14 MED FILL — AMLODIPINE BESYLATE 10 MG T: 10 | 60 days supply | Qty: 60 | Fill #0

## 2015-11-14 NOTE — Discharge Instructions (Signed)
You are given resources for primary care provider.   Return without fail for worsening symptoms, including worsening pain, difficulty breathing, passing out, or any other symptoms concerning to you.  Nonspecific Chest Pain  Chest pain can be caused by many different conditions. There is always a chance that your pain could be related to something serious, such as a heart attack or a blood clot in your lungs. Chest pain can also be caused by conditions that are not life-threatening. If you have chest pain, it is very important to follow up with your health care provider. CAUSES  Chest pain can be caused by:  Heartburn.  Pneumonia or bronchitis.  Anxiety or stress.  Inflammation around your heart (pericarditis) or lung (pleuritis or pleurisy).  A blood clot in your lung.  A collapsed lung (pneumothorax). It can develop suddenly on its own (spontaneous pneumothorax) or from trauma to the chest.  Shingles infection (varicella-zoster virus).  Heart attack.  Damage to the bones, muscles, and cartilage that make up your chest wall. This can include:  Bruised bones due to injury.  Strained muscles or cartilage due to frequent or repeated coughing or overwork.  Fracture to one or more ribs.  Sore cartilage due to inflammation (costochondritis). RISK FACTORS  Risk factors for chest pain may include:  Activities that increase your risk for trauma or injury to your chest.  Respiratory infections or conditions that cause frequent coughing.  Medical conditions or overeating that can cause heartburn.  Heart disease or family history of heart disease.  Conditions or health behaviors that increase your risk of developing a blood clot.  Having had chicken pox (varicella zoster). SIGNS AND SYMPTOMS Chest pain can feel like:  Burning or tingling on the surface of your chest or deep in your chest.  Crushing, pressure, aching, or squeezing pain.  Dull or sharp pain that is worse when  you move, cough, or take a deep breath.  Pain that is also felt in your back, neck, shoulder, or arm, or pain that spreads to any of these areas. Your chest pain may come and go, or it may stay constant. DIAGNOSIS Lab tests or other studies may be needed to find the cause of your pain. Your health care provider may have you take a test called an ambulatory ECG (electrocardiogram). An ECG records your heartbeat patterns at the time the test is performed. You may also have other tests, such as:  Transthoracic echocardiogram (TTE). During echocardiography, sound waves are used to create a picture of all of the heart structures and to look at how blood flows through your heart.  Transesophageal echocardiogram (TEE).This is a more advanced imaging test that obtains images from inside your body. It allows your health care provider to see your heart in finer detail.  Cardiac monitoring. This allows your health care provider to monitor your heart rate and rhythm in real time.  Holter monitor. This is a portable device that records your heartbeat and can help to diagnose abnormal heartbeats. It allows your health care provider to track your heart activity for several days, if needed.  Stress tests. These can be done through exercise or by taking medicine that makes your heart beat more quickly.  Blood tests.  Imaging tests. TREATMENT  Your treatment depends on what is causing your chest pain. Treatment may include:  Medicines. These may include:  Acid blockers for heartburn.  Anti-inflammatory medicine.  Pain medicine for inflammatory conditions.  Antibiotic medicine, if an infection is present.  Medicines to dissolve blood clots.  Medicines to treat coronary artery disease.  Supportive care for conditions that do not require medicines. This may include:  Resting.  Applying heat or cold packs to injured areas.  Limiting activities until pain decreases. HOME CARE INSTRUCTIONS  If  you were prescribed an antibiotic medicine, finish it all even if you start to feel better.  Avoid any activities that bring on chest pain.  Do not use any tobacco products, including cigarettes, chewing tobacco, or electronic cigarettes. If you need help quitting, ask your health care provider.  Do not drink alcohol.  Take medicines only as directed by your health care provider.  Keep all follow-up visits as directed by your health care provider. This is important. This includes any further testing if your chest pain does not go away.  If heartburn is the cause for your chest pain, you may be told to keep your head raised (elevated) while sleeping. This reduces the chance that acid will go from your stomach into your esophagus.  Make lifestyle changes as directed by your health care provider. These may include:  Getting regular exercise. Ask your health care provider to suggest some activities that are safe for you.  Eating a heart-healthy diet. A registered dietitian can help you to learn healthy eating options.  Maintaining a healthy weight.  Managing diabetes, if necessary.  Reducing stress. SEEK MEDICAL CARE IF:  Your chest pain does not go away after treatment.  You have a rash with blisters on your chest.  You have a fever. SEEK IMMEDIATE MEDICAL CARE IF:   Your chest pain is worse.  You have an increasing cough, or you cough up blood.  You have severe abdominal pain.  You have severe weakness.  You faint.  You have chills.  You have sudden, unexplained chest discomfort.  You have sudden, unexplained discomfort in your arms, back, neck, or jaw.  You have shortness of breath at any time.  You suddenly start to sweat, or your skin gets clammy.  You feel nauseous or you vomit.  You suddenly feel light-headed or dizzy.  Your heart begins to beat quickly, or it feels like it is skipping beats. These symptoms may represent a serious problem that is an  emergency. Do not wait to see if the symptoms will go away. Get medical help right away. Call your local emergency services (911 in the [place].). Do not drive yourself to the hospital.   This information is not intended to replace advice given to you by your health care provider. Make sure you discuss any questions you have with your health care provider.   [employer] Resource Guide Financial Assistance The [employer] 211 is a great source of information about community services available.  Access by dialing 2-1-1 from anywhere in [place], or by website -  PooledIncome.plwww.nc211.org.   Other Local Resources (Updated 05/2015)  Financial Assistance   Services    Phone Number and Address  [facility]  Low-cost medical care - 1st and 3rd Saturday of every month  Must not qualify for public or private insurance and must have limited income [phone] [address] [place], [place]    [place] [employer] Department of Social Services  Child care  Emergency assistance for housing and [employer]ilities  Food stamps  Medicaid [phone] [address] [place], [place] [phone]   [facility]  Low-cost medical care for children, communicable diseases, sexually-transmitted diseases, immunizations, maternity care, womens health and  family planning [phone] [address] [place], [place] [address]  [facility]   Medication assistance for [facility] residents  Must meet income requirements [phone] [address] [place], [place].    [facility] Social Services  Child care  Emergency assistance for housing and [employer]  Medicaid [phone] [address] [place], [place] [address]  [facility]   Low-cost medical care,   Monday through Friday, 9 am to 6 pm.   Accepts Medicare/Medicaid, and self-pay [phone] [address] [place], [place] [address]  [facility]  Low-cost medical care - Monday through Friday, 8:30 am - 5:30 pm  Accepts Medicaid and self-pay [phone] [address] [place], [place] [address]   [facility]  Primary medical care, including for those with sickle cell disease  Accepts Medicare, Medicaid, insurance and self-pay [phone] [address] [place], [place]  [facility]   Primary medical care  Accepts Medicare, [place], insurance and self-pay [phone] [address] [place], [place] [address]   [facility] Department of Social Services  Child care  Emergency assistance for housing and [employer]  Medicaid [phone] [address] [place], [place] [address]  [facility] Department of Health and [employer]  Child care  Emergency assistance for housing and [employer]  Medicaid [phone] [address] [place], [place] [address]   [facility] Medication Assistance Program  Medication assistance for [facility] residents with no insurance only  Must have a primary care doctor [phone] [address] [place], [place]  [facility]   Primary medical care  [place], [place], insurance  [phone] [place], [place]  [facility]   Medication assistance [phone]  [facility]   Primary medical care  Accepts Medicare, [place], insurance and self-pay [phone] [address] [place], [place] [address]  [facility]   Primary medical care  Accepts Medicare, [place], insurance and self-pay [phone] [address] [place], [place] [address]  [facility]  For [place] residents between the ages of 11 and 81 who do not have any form of health insurance, Medicare, [place], or [place] benefits.   Services are provided free of charge to uninsured patients who fall within federal poverty guidelines.    Hours: Tuesdays and Thursdays, 4:15 - 8 pm [phone] [address] [place], [place] [address]  [facility]     Primary medical care  Dental care  Nutritional counseling  Pharmacy  Accepts Medicaid, Medicare, most insurance.  Fees are adjusted based on ability to pay.   [phone] [facility] [address] [place], [place]  [phone] [facility] [address] [place], [place]  [phone] [facility] [place], [place]  [phone] [facility], [address] [place], [place]  [phone] [facility] [address] [place], [place]  [facility]  Womens health and family planning [phone] [address] [place], [place]  [facility] Department of Social Services  Child care  Emergency assistance for housing and [employer]  Medicaid [phone] [address] [place], [place] [address]   [facility]    Ages 38 and older  Hours: Mondays and Thursdays, 7:00 am - 9:00 am  Patients are seen on a first come, first served basis. [phone], ext. [phone] [place], [place]  [facility] Division of Social Services  Child care  Emergency assistance for housing and [employer]  Medicaid [phone] [address], [place] [address]  [facility]  Medication assistance  Rental assistance  Food pantry  Medication assistance  Housing assistance  Emergency food distribution  Utility assistance [phone] [address] [place], [place]  [phone]  [address] [place], [place] [address] Hours: Tuesdays and Thursdays from 9am - 12 noon by appointment only  [phone] [address] [place], [place] [address]  [facility] - Lanae Boast   Accepts private insurance, [place], and [place].  Payment is based on a sliding scale for those without insurance.  Hours: Mondays, Tuesdays and Thursdays, 8:30 am - 5:30 pm.   [phone] [address] [place], [place]  [facility] - Family Medicine at [facility], [place], and [place].  Payment is based on a sliding scale for those without insurance. [phone] [address] [place], [place]  [facility] - Pediatrics at E. Scientist, research (physical sciences), [employer], and [place].  Payment is based on a sliding scale for those without insurance [phone] [address], [employer], [place]  [facility] - Pediatrics at [employer], [place], and [place].  Payment is based on a sliding scale for those without insurance. [phone] [address] [place], [place]  [facility] - Pediatrics at [facility], [place], and [place].  Payment is based on a sliding scale for those without insurance. [phone], ext. [phone] [place], [place].    [facility]  Maternity care.  Accepts Medicaid and self-pay. [phone] [address] [place], [place]

## 2015-11-14 NOTE — ED Notes (Signed)
Pt placed on auto vitals Q30.  

## 2015-11-14 NOTE — ED Provider Notes (Signed)
CSN: [id number]     Arrival date & time 11/14/15  1141 History   First MD Initiated Contact with Patient 11/14/15 1201     Chief Complaint  Patient presents with  . Chest Pain     (Consider location/radiation/quality/duration/timing/severity/associated sxs/prior Treatment) HPI 35 year old male who presents with chest tightness. He has a history of hypertension, obesity, and prior history of tobacco use. States onset of chest tightness and feeling "weird" 3 days ago while at rest. He has not had similar pain in the past. states that he exerted himself, mowing the yard and doing some exertional activity the day prior but without any symptoms then. Pain seems to be slightly worse with movement and position changes. Pain is not pleuritic, not associated with shortness of breath. States that today while at rest felt very hot and sweaty. He felt very lightheaded as well but did not have syncope. He decided to come to the ED for evaluation. No nausea, vomiting, lower extremity edema, orthopnea or PND.   Past Medical History  Diagnosis Date  . Hypertension    Past Surgical History  Procedure Laterality Date  . Eye surgery     History reviewed. No pertinent family history. Social History  Substance Use Topics  . Smoking status: Current Every Day Smoker -- 0.50 packs/day    Types: Cigarettes  . Smokeless tobacco: None  . Alcohol Use: Yes    Review of Systems 10/14 systems reviewed and are negative other than those stated in the HPI   Allergies  Percocet  Home Medications   Prior to Admission medications   Medication Sig Start Date End Date Taking? Authorizing Provider  amLODipine (NORVASC) 10 MG tablet Take 1 tablet (10 mg total) by mouth daily. 11/14/15   Lavera Guise, MD  amLODipine (NORVASC) 5 MG tablet Take 5 mg by mouth daily.    Historical Provider, MD  hydrochlorothiazide (HYDRODIURIL) 25 MG tablet Take 1 tablet (25 mg total) by mouth daily. 07/11/13   Jerelyn Scott, MD   ibuprofen (ADVIL,MOTRIN) 600 MG tablet Take 1 tablet (600 mg total) by mouth every 6 (six) hours as needed. 02/20/15   Renne Crigler, PA-C  potassium chloride (K-DUR) 10 MEQ tablet Take 1 tablet (10 mEq total) by mouth daily. 07/11/13   Jerelyn Scott, MD  potassium chloride SA (K-DUR,KLOR-CON) 20 MEQ tablet Take 1 tablet (20 mEq total) by mouth 2 (two) times daily. 06/11/13   Teressa Lower, NP   BP 166/108 mmHg  Pulse 72  Temp(Src) 98.1 F (36.7 C) (Oral)  Resp 14  Ht  (1.88 m)  Wt 270 lb (122.471 kg)  BMI 34.65 kg/m2  SpO2 99% Physical Exam Physical Exam  Nursing note and vitals reviewed. Constitutional: Well developed, well nourished, non-toxic, and in no acute distress Head: Normocephalic and atraumatic.  Mouth/Throat: Oropharynx is clear and moist.  Neck: Normal range of motion. Neck supple.  Cardiovascular: Normal rate and regular rhythm.  NO edema. Pulmonary/Chest: Effort normal and breath sounds normal.  Abdominal: Soft. There is no tenderness. There is no rebound and no guarding.  Musculoskeletal: Normal range of motion.  Neurological: Alert, no facial droop, fluent speech, moves all extremities symmetrically Skin: Skin is warm and dry.  Psychiatric: Cooperative   ED Course  Procedures (including critical care time) Labs Review Labs Reviewed  BASIC METABOLIC PANEL - Abnormal; Notable for the following:    Potassium 3.1 (*)    Glucose, Bld 137 (*)    Calcium 8.6 (*)  All other components within normal limits  CBC WITH DIFFERENTIAL/PLATELET  TROPONIN I  TROPONIN I    Imaging Review Dg Chest 2 View  11/14/2015  CLINICAL DATA:  Chest pain, shortness of breath and diaphoresis for 2 days. EXAM: CHEST  2 VIEW COMPARISON:  PA and lateral chest 07/11/2013. FINDINGS: There is mild cardiomegaly. No new pulmonary edema. Lungs are clear. No pneumothorax or pleural effusion. No focal bony abnormality. IMPRESSION: Cardiomegaly without acute disease. Electronically  Signed   By: Drusilla Kannerhomas  Dalessio M.D.   On: 11/14/2015 12:44   I have personally reviewed and evaluated these images and lab results as part of my medical decision-making.   EKG Interpretation   Date/Time:  Tuesday November 14 2015 15:09:01 EDT Ventricular Rate:  75 PR Interval:  170 QRS Duration: 101 QT Interval:  403 QTC Calculation: 451 R Axis:   59 Text Interpretation:  Age not entered, assumed to be  [age] years old for  purpose of ECG interpretation Sinus rhythm Borderline repolarization  abnormality T-wave abnormalities seen on prior EKG  Confirmed by Lanyah Spengler MD,  Annabelle HarmanANA ([id number]) on 11/14/2015 4:10:20 PM      MDM   Final diagnoses:  Chest tightness   35 year old male with history of hypertension and prior history of tobacco use who presents with chest tightness. Is nontoxic in no acute distress. He is hypertensive here in the emergency department, but has not been with his blood pressure medications for quite some time. His EKG is not ischemic, but with some borderline T wave abnormalities that are nonspecific. Symptoms seem atypical for that of ACS. Heart score 3 for nonspecific repolarization disturbance, risk factors of HTN, smoking quit < 3months ago, and obesity. Still considered low risk. Serial troponin is negative. Serial EKg without dynamic changes. Chest pain resolved in ED. CXR without cardiopulmonary processes. PERC negative, low risk PE and pain atypical for that of PE. At this time I have low suspicion for serious cardiopulmonary G thoracic etiology of his symptoms. Is given refill for his blood pressure medication, and it referred him for primary care follow-up. Strict return instructions are reviewed. He expressed understanding of all discharge instructions, and felt comfortable to plan of care    Lavera Guiseana Duo Meliya Mcconahy, MD 11/14/15 1722

## 2015-11-14 NOTE — ED Notes (Signed)
MD at bedside. 

## 2015-11-14 NOTE — ED Notes (Signed)
Chest pain x 2 days.  Some sob, some diaphoresis.  Pt has not been taking his BP medications for over one year.  Pt took a dose of BP medication yesterday and today.  Pt needs refill of prescriptions and referral to health and wellness.

## 2015-11-14 NOTE — ED Notes (Signed)
Pt reports L side chest tightness started Sunday while playing video games. Pt states the pain radiates to the L shoulder and has been constant since Sunday. Pt states today he broke out in a sweat while sitting still which prompted him to come in for eval. Pt denies SOB, N/V. Pt chest non tender to palpation.

## 2016-05-23 ENCOUNTER — Emergency Department (HOSPITAL_BASED_OUTPATIENT_CLINIC_OR_DEPARTMENT_OTHER)
Admission: EM | Admit: 2016-05-23 | Discharge: 2016-05-23 | Disposition: A | Discharge status: Home / Self Care | Payer: BLUE CROSS/BLUE SHIELD | Attending: Emergency Medicine | Admitting: Emergency Medicine

## 2016-05-23 ENCOUNTER — Encounter (HOSPITAL_BASED_OUTPATIENT_CLINIC_OR_DEPARTMENT_OTHER): Payer: Self-pay | Admitting: Emergency Medicine

## 2016-05-23 DIAGNOSIS — Z79899 Other long term (current) drug therapy: Secondary | ICD-10-CM | POA: Insufficient documentation

## 2016-05-23 DIAGNOSIS — R040 Epistaxis: Secondary | ICD-10-CM | POA: Diagnosis present

## 2016-05-23 DIAGNOSIS — F17210 Nicotine dependence, cigarettes, uncomplicated: Secondary | ICD-10-CM | POA: Diagnosis not present

## 2016-05-23 DIAGNOSIS — I10 Essential (primary) hypertension: Secondary | ICD-10-CM | POA: Diagnosis not present

## 2016-05-23 LAB — CBC WITH DIFFERENTIAL/PLATELET
BASOS ABS: 0 10*3/uL (ref 0.0–0.1)
Basophils Relative: 0 %
EOS PCT: 8 %
Eosinophils Absolute: 0.5 10*3/uL (ref 0.0–0.7)
HEMATOCRIT: 39.1 % (ref 39.0–52.0)
Hemoglobin: 12.7 g/dL — ABNORMAL LOW (ref 13.0–17.0)
LYMPHS ABS: 2.3 10*3/uL (ref 0.7–4.0)
LYMPHS PCT: 37 %
MCH: 26.6 pg (ref 26.0–34.0)
MCHC: 32.5 g/dL (ref 30.0–36.0)
MCV: 82 fL (ref 78.0–100.0)
MONO ABS: 0.7 10*3/uL (ref 0.1–1.0)
MONOS PCT: 12 %
NEUTROS ABS: 2.6 10*3/uL (ref 1.7–7.7)
Neutrophils Relative %: 43 %
PLATELETS: 269 10*3/uL (ref 150–400)
RBC: 4.77 MIL/uL (ref 4.22–5.81)
RDW: 13.5 % (ref 11.5–15.5)
WBC: 6.1 10*3/uL (ref 4.0–10.5)

## 2016-05-23 MED ORDER — OXYMETAZOLINE HCL 0.05 % NA SOLN
1.0000 | Freq: Once | NASAL | Status: AC
  Administered 2016-05-23: 1 via NASAL
  Filled 2016-05-23: qty 15

## 2016-05-23 NOTE — Discharge Instructions (Signed)
Afrin nasal spray: 2 sprays in each nostril every 4 hours for the next 3 days.  Pinch your nostrils shut for 15 minutes and tilt your head forward should your bleeding recur. If it does not resolve with this, return to the emergency department to be reevaluated.

## 2016-05-23 NOTE — ED Triage Notes (Signed)
Pt states he has ran out of BP medication.

## 2016-05-23 NOTE — ED Notes (Signed)
No active bleeding at this time. 

## 2016-05-23 NOTE — ED Triage Notes (Signed)
Nose bleed x 2 in last couple of days.

## 2016-05-23 NOTE — ED Provider Notes (Signed)
[facility] Provider Note   CSN: [id number] Arrival date & time: 05/23/16  0254     History   Chief Complaint Chief Complaint  Patient presents with  . Epistaxis    HPI Christopher Melendez is a 35 y.o. male.  Patient is a 35 year old male who presents with complaints of nosebleed. He reports some congestion over the past several days. 2 days ago he had an episode of bleeding from the nose which resolved with direct pressure. To return earlier this evening and he presents for evaluation of this. This began in the absence of any injury or trauma.   The history is provided by the patient.  Epistaxis   This is a new problem. The current episode started 2 days ago. Episode frequency: Intermittently. The problem has been gradually worsening. The problem is associated with an unknown factor. The bleeding has been from the left nare. He has tried applying pressure for the symptoms.    Past Medical History:  Diagnosis Date  . Hypertension     There are no active problems to display for this patient.   Past Surgical History:  Procedure Laterality Date  . EYE SURGERY         Home Medications    Prior to Admission medications   Medication Sig Start Date End Date Taking? Authorizing Provider  amLODipine (NORVASC) 10 MG tablet Take 1 tablet (10 mg total) by mouth daily. 11/14/15   Lavera Guiseana Duo Liu, MD  amLODipine (NORVASC) 5 MG tablet Take 5 mg by mouth daily.    Historical Provider, MD  hydrochlorothiazide (HYDRODIURIL) 25 MG tablet Take 1 tablet (25 mg total) by mouth daily. 07/11/13   Jerelyn ScottMartha Linker, MD  ibuprofen (ADVIL,MOTRIN) 600 MG tablet Take 1 tablet (600 mg total) by mouth every 6 (six) hours as needed. 02/20/15   Renne CriglerJoshua Geiple, PA-C  potassium chloride (K-DUR) 10 MEQ tablet Take 1 tablet (10 mEq total) by mouth daily. 07/11/13   Jerelyn ScottMartha Linker, MD  potassium chloride SA (K-DUR,KLOR-CON) 20 MEQ tablet Take 1 tablet (20 mEq total) by mouth 2 (two) times daily. 06/11/13    Teressa LowerVrinda Pickering, NP    Family History No family history on file.  Social History Social History  Substance Use Topics  . Smoking status: Current Every Day Smoker    Packs/day: 0.50    Types: Cigarettes  . Smokeless tobacco: Never Used  . Alcohol use Yes     Allergies   Percocet [oxycodone-acetaminophen]   Review of Systems Review of Systems  HENT: Positive for nosebleeds.   All other systems reviewed and are negative.    Physical Exam Updated Vital Signs BP (!) 164/104   Pulse 66   Temp 98.1 F (36.7 C) (Oral)   Resp 18   Ht 6\' 2"  (1.88 m)   Wt 250 lb (113.4 kg)   SpO2 100%   BMI 32.10 kg/m   Physical Exam  Constitutional: He is oriented to person, place, and time. He appears well-developed and well-nourished. No distress.  HENT:  Head: Normocephalic and atraumatic.  Mouth/Throat: Oropharynx is clear and moist.  The bilateral nares are clear. There is no active bleeding or obvious visible abnormality.  Neck: Normal range of motion. Neck supple.  Cardiovascular: Normal rate and regular rhythm.  Exam reveals no friction rub.   No murmur heard. Pulmonary/Chest: Effort normal and breath sounds normal. No respiratory distress. He has no wheezes. He has no rales.  Abdominal: Soft. Bowel sounds are normal. He exhibits no distension.  There is no tenderness.  Musculoskeletal: Normal range of motion. He exhibits no edema.  Neurological: He is alert and oriented to person, place, and time. Coordination normal.  Skin: Skin is warm and dry. He is not diaphoretic.  Nursing note and vitals reviewed.    ED Treatments / Results  Labs (all labs ordered are listed, but only abnormal results are displayed) Labs Reviewed  CBC WITH DIFFERENTIAL/PLATELET - Abnormal; Notable for the following:       Result Value   Hemoglobin 12.7 (*)    All other components within normal limits    EKG  EKG Interpretation None       Radiology No results  found.  Procedures Procedures (including critical care time)  Medications Ordered in ED Medications  oxymetazoline (AFRIN) 0.05 % nasal spray 1 spray (1 spray Each Nare Given 05/23/16 0330)     Initial Impression / Assessment and Plan / ED Course  I have reviewed the triage vital signs and the nursing notes.  Pertinent labs & imaging results that were available during my care of the patient were reviewed by me and considered in my medical decision making (see chart for details).  Clinical Course     CBC is unremarkable and the patient's bleeding has been controlled with Afrin nasal spray. He will be discharged with continued nasal spray, and direct pressure if his bleeding recurs.  Final Clinical Impressions(s) / ED Diagnoses   Final diagnoses:  None    New Prescriptions New Prescriptions   No medications on file     Geoffery Lyonsouglas Darrelyn Morro, MD 05/23/16 [phone]

## 2023-01-23 ENCOUNTER — Emergency Department (HOSPITAL_BASED_OUTPATIENT_CLINIC_OR_DEPARTMENT_OTHER): Payer: BLUE CROSS/BLUE SHIELD

## 2023-01-23 ENCOUNTER — Encounter (HOSPITAL_BASED_OUTPATIENT_CLINIC_OR_DEPARTMENT_OTHER): Payer: Self-pay | Admitting: Emergency Medicine

## 2023-01-23 ENCOUNTER — Emergency Department (HOSPITAL_BASED_OUTPATIENT_CLINIC_OR_DEPARTMENT_OTHER)
Admission: EM | Admit: 2023-01-23 | Discharge: 2023-01-23 | Disposition: A | Discharge status: Home / Self Care | Payer: BLUE CROSS/BLUE SHIELD | Attending: Emergency Medicine | Admitting: Emergency Medicine

## 2023-01-23 ENCOUNTER — Other Ambulatory Visit (HOSPITAL_BASED_OUTPATIENT_CLINIC_OR_DEPARTMENT_OTHER): Payer: Self-pay

## 2023-01-23 ENCOUNTER — Other Ambulatory Visit: Payer: Self-pay

## 2023-01-23 DIAGNOSIS — M79642 Pain in left hand: Secondary | ICD-10-CM | POA: Diagnosis present

## 2023-01-23 DIAGNOSIS — I10 Essential (primary) hypertension: Secondary | ICD-10-CM | POA: Diagnosis not present

## 2023-01-23 DIAGNOSIS — M79602 Pain in left arm: Secondary | ICD-10-CM | POA: Insufficient documentation

## 2023-01-23 DIAGNOSIS — Z79899 Other long term (current) drug therapy: Secondary | ICD-10-CM | POA: Insufficient documentation

## 2023-01-23 DIAGNOSIS — Y92410 Unspecified street and highway as the place of occurrence of the external cause: Secondary | ICD-10-CM | POA: Diagnosis not present

## 2023-01-23 MED ORDER — NAPROXEN 500 MG PO TABS
500.0000 mg | ORAL_TABLET | Freq: Two times a day (BID) | ORAL | 0 refills | Status: AC | PRN
  Filled 2023-01-23: qty 30, 15d supply, fill #0

## 2023-01-23 MED ORDER — NAPROXEN 250 MG PO TABS
500.0000 mg | ORAL_TABLET | Freq: Once | ORAL | Status: AC
  Administered 2023-01-23: 500 mg via ORAL
  Filled 2023-01-23: qty 2

## 2023-01-23 NOTE — Discharge Instructions (Signed)
As discussed, visit to the emergency department today overall reassuring.  X-rays are negative for any fracture or dislocation.  Recommend continued use of Aleve/naproxen in the outpatient setting for treatment of your pain/inflammation.  You may ice affected areas as well.  Please do not hesitate to return to emergency department if there are worrisome signs or symptoms we discussed become apparent.

## 2023-01-23 NOTE — ED Notes (Addendum)
EDPA at [facility]. Pt sitting in recliner chair, alert, NAD, calm.
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
## 2023-06-22 ENCOUNTER — Emergency Department (HOSPITAL_BASED_OUTPATIENT_CLINIC_OR_DEPARTMENT_OTHER)
Admission: EM | Admit: 2023-06-22 | Discharge: 2023-06-22 | Disposition: A | Discharge status: Home / Self Care | Payer: Self-pay | Attending: Emergency Medicine | Admitting: Emergency Medicine

## 2023-06-22 ENCOUNTER — Encounter (HOSPITAL_BASED_OUTPATIENT_CLINIC_OR_DEPARTMENT_OTHER): Payer: Self-pay

## 2023-06-22 ENCOUNTER — Emergency Department (HOSPITAL_BASED_OUTPATIENT_CLINIC_OR_DEPARTMENT_OTHER): Payer: Self-pay

## 2023-06-22 ENCOUNTER — Other Ambulatory Visit: Payer: Self-pay

## 2023-06-22 DIAGNOSIS — M778 Other enthesopathies, not elsewhere classified: Secondary | ICD-10-CM | POA: Insufficient documentation

## 2023-06-22 DIAGNOSIS — Z79899 Other long term (current) drug therapy: Secondary | ICD-10-CM | POA: Insufficient documentation

## 2023-06-22 DIAGNOSIS — I10 Essential (primary) hypertension: Secondary | ICD-10-CM | POA: Insufficient documentation

## 2023-06-22 DIAGNOSIS — M25511 Pain in right shoulder: Secondary | ICD-10-CM

## 2023-06-22 DIAGNOSIS — E119 Type 2 diabetes mellitus without complications: Secondary | ICD-10-CM | POA: Insufficient documentation

## 2023-06-22 MED ORDER — METHOCARBAMOL 500 MG PO TABS: 500.0000 mg | ORAL_TABLET | Freq: Four times a day (QID) | ORAL | 0 refills | Status: AC

## 2023-06-22 MED ORDER — KETOROLAC TROMETHAMINE 30 MG/ML IJ SOLN
30.0000 mg | Freq: Once | INTRAMUSCULAR | Status: AC
  Administered 2023-06-22: 30 mg via INTRAMUSCULAR
  Filled 2023-06-22: qty 1

## 2023-06-22 MED ORDER — DICLOFENAC SODIUM 75 MG PO TBEC: 75.0000 mg | DELAYED_RELEASE_TABLET | Freq: Two times a day (BID) | ORAL | 0 refills | Status: AC

## 2023-06-22 NOTE — ED Triage Notes (Signed)
Reports R shoulder pain and neck pain since yesterday morning. States pain is worse when trying to lift arm. Denies chest pain or SHOB. No known injury
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
## 2023-06-22 NOTE — Discharge Instructions (Signed)
Schedule to see the Orthopaedist for recheck
